# Patient Record
Sex: Male | Born: 1965 | Race: White | Hispanic: No | Marital: Married | Smoking: Never smoker
Health system: Southern US, Community
[De-identification: ages and names within clinical notes are randomized; demographics above are authoritative.]

---

## 2007-05-01 ENCOUNTER — Encounter: Admission: RE | Admit: 2007-05-01 | Discharge: 2007-05-01 | Payer: Self-pay | Admitting: Otolaryngology

## 2015-06-08 ENCOUNTER — Encounter: Payer: Self-pay | Admitting: Sports Medicine

## 2015-06-08 ENCOUNTER — Ambulatory Visit (INDEPENDENT_AMBULATORY_CARE_PROVIDER_SITE_OTHER): Payer: BLUE CROSS/BLUE SHIELD | Admitting: Sports Medicine

## 2015-06-08 VITALS — BP 140/71 | HR 86 | Ht 74.0 in | Wt 300.0 lb

## 2015-06-08 DIAGNOSIS — M7582 Other shoulder lesions, left shoulder: Secondary | ICD-10-CM | POA: Diagnosis not present

## 2015-06-08 MED ORDER — NITROGLYCERIN 0.2 MG/HR TD PT24: MEDICATED_PATCH | TRANSDERMAL | Status: AC

## 2015-06-08 NOTE — Assessment & Plan Note (Signed)
HEP - he is knowledgable and works out with weights regularly Nistructed in low weight and high rep for RCT  NTG protocol  Reck in 6 weeks

## 2015-06-08 NOTE — Progress Notes (Signed)
Patient ID: Shane Tapia, male   DOB: May 24, 1965, 50 y.o.   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 Palpation is normal with no tenderness over AC joint or bicipital groove. ROM is full in all planes.  He does show hesitation on full elevation on left Rotator cuff strength normal throughout.  Very positive signs of impingement with weakness and pain on  Hawkin's tests and empty can. Speeds and Yergason's tests normal. No labral pathology noted with negative Obrien's, negative clunk and good stability. Normal scapular function observed. No painful arc and no drop arm sign. No apprehension sign  Shoulder [place] BT normal AC joint with only mild arthritic change Subscap - tendon intact with some mild distal calcification SST - distally there is hypoechoic change and anterior edge small tear (0.5 cm) This impinges with motion Infraspinatus is normal

## 2015-06-16 ENCOUNTER — Encounter: Payer: Self-pay | Admitting: Sports Medicine

## 2015-07-21 ENCOUNTER — Ambulatory Visit (INDEPENDENT_AMBULATORY_CARE_PROVIDER_SITE_OTHER): Payer: BLUE CROSS/BLUE SHIELD | Admitting: Sports Medicine

## 2015-07-21 ENCOUNTER — Encounter: Payer: Self-pay | Admitting: Sports Medicine

## 2015-07-21 VITALS — BP 133/73 | HR 90 | Ht 74.0 in | Wt 300.0 lb

## 2015-07-21 DIAGNOSIS — M7582 Other shoulder lesions, left shoulder: Secondary | ICD-10-CM

## 2015-07-21 NOTE — Progress Notes (Signed)
Patient ID: Shane Tapia, male   DOB: Jul 16, 1965, 50 y.o.   MRN: [id number]  CC: Left shoulder pain, follow-up  Referred: courtesy of Dr Renne CriglerPharr  Athletic M with Lt shoulder pain, diagnosed with rotator cuff tendinitis at last visit on 06/08/2015 - Lifts weights - but not a specific injury - Night pain - lying or when elevated. This has significantly although not completely improved since his last visit - Aggravating factors - elevation of arm/ [facility]. He has avoided these activities since his last visit - Using nitroglycerin patches daily. He denies any side effects. - No new injuries. - Progressing with rotator cuff strengthening. He has increased his resistance up to 10 pounds. - Significant overall improvement after initial 3 weeks.  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 Palpation is normal with no tenderness over AC joint or bicipital groove. ROM is full in all planes. Minimal hesitancy with abduction Rotator cuff strength normal throughout.  Mild to moderately positive signs of impingement with weakness and pain on  Hawkin's tests and empty can. Negative Neer Speeds and Yergason's tests normal. Normal scapular function observed. No painful arc and no drop arm sign. No apprehension sign  Shoulder US, repeat from 06/08/2015 BT normal AC joint with only mild arthritic change Subscap - tendon intact with some mild distal calcification. No change on today's scan SST -the previously identified hypoechoic change and anterior edge small tear (0.5 cm) appears to have healed. Impingement with motion has significantly improved. Only mild  impingement identified. Infraspinatus is normal  Left rotator cuff pathology: This includes a persistent distal subscap calcific tendinitis and a resolving supraspinatus tear and subdeltoid bursitis.  - Clinically doing much better as well. - Continue nitroglycerin - Continue working on more reps rather than increased weight. Limit rotator cuff strengthening to 10 pounds max. - Follow-up in 6-7 weeks. Call with any questions in the interim.

## 2015-09-15 ENCOUNTER — Ambulatory Visit (INDEPENDENT_AMBULATORY_CARE_PROVIDER_SITE_OTHER): Payer: BLUE CROSS/BLUE SHIELD | Admitting: Sports Medicine

## 2015-09-15 ENCOUNTER — Encounter: Payer: Self-pay | Admitting: Sports Medicine

## 2015-09-15 VITALS — BP 130/72 | HR 88 | Ht 74.0 in | Wt 295.0 lb

## 2015-09-15 DIAGNOSIS — M7582 Other shoulder lesions, left shoulder: Secondary | ICD-10-CM

## 2015-09-15 NOTE — Progress Notes (Signed)
Patient ID: Shane Tapia, male   DOB: May 27, 1965, 50 y.o.   MRN: [id number]  CC: Left shoulder pain, follow-up  Referred: courtesy of Dr Renne CriglerPharr  Athletic M with Lt shoulder pain, diagnosed with rotator cuff tendinitis at last visit on 07/21/2015 - Lifts weights - but not a specific injury - Night pain - lying or when elevated. This has continued to improve since his last visit and he now only has tightness in the morning. It does not wake him from sleep.  - Using nitroglycerin patches daily. He denies any side effects. - No new injuries. - Progressing with rotator cuff strengthening. He has increased his resistance up to 10 pounds.he feels ready to go past 90 with abduction and forward flexion exercises. 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 Palpation is normal with no tenderness over AC joint or bicipital groove. ROM is full in all planes. No longer any hesitancy with abduction.  Rotator cuff strength normal throughout.  Negative Neer and Hawkins. Negative full can. Minimal pain with empty can. 5 out of 5 strength with both full and empty can.  Speeds and Yergason's tests normal. Normal scapular function observed. No painful arc and no drop arm sign. No apprehension sign  Shoulder US, repeat from 06/08/2015 BT normal AC joint with only mild arthritic change Subscap - tendon intact with some mild distal calcification. No change on today's scan SST - again,the previously identified hypoechoic change and anterior edge small tear (0.5 cm) appears to have healed. Infraspinatus is normal  Left rotator cuff pathology:  This includes a persistent distal subscap calcific tendinitis and resolved supraspinatus tear both the ultrasound and clinically. Overall he is very pleased with his progress over the last 3-1/2 months.  - Taper off of nitroglycerin - begin attempting overhead lifts if desired. Continue gradual return to previous lifting. Discussed vulnerability of the shoulder above 90 forward flexion and abduction. - Follow up with any questions or concerns.

## 2020-10-28 ENCOUNTER — Other Ambulatory Visit: Payer: Self-pay | Admitting: Internal Medicine

## 2020-10-28 DIAGNOSIS — E7800 Pure hypercholesterolemia, unspecified: Secondary | ICD-10-CM

## 2020-12-01 ENCOUNTER — Ambulatory Visit
Admission: RE | Admit: 2020-12-01 | Discharge: 2020-12-01 | Disposition: A | Discharge status: Home / Self Care | Payer: No Typology Code available for payment source | Source: Ambulatory Visit | Attending: Internal Medicine | Admitting: Internal Medicine

## 2020-12-01 DIAGNOSIS — E7800 Pure hypercholesterolemia, unspecified: Secondary | ICD-10-CM

## 2022-06-04 IMAGING — CT CT CARDIAC CORONARY ARTERY CALCIUM SCORE
3 series · 14 of 20 positions shown, 16 images · non-contrast
Comparison: None.

CLINICAL DATA: 54-year-old Caucasian male with history
hyperlipidemia.

EXAM:
CT CARDIAC CORONARY ARTERY CALCIUM SCORE
TECHNIQUE: Non-contrast imaging through the heart was performed using
prospective ECG gating. Image post processing was performed on an
independent workstation, allowing for quantitative analysis of the
heart and coronary arteries. Note that this exam targets the heart
and the chest was not imaged in its entirety.

[Series 2: calcium scoring 2.00 qr36 bestdiast 63% hrt calciu · axial · 0.46mm/px · z∈[+1629,+1707]mm · 4 of 65 slices shown]
[im 13/65  vessel]
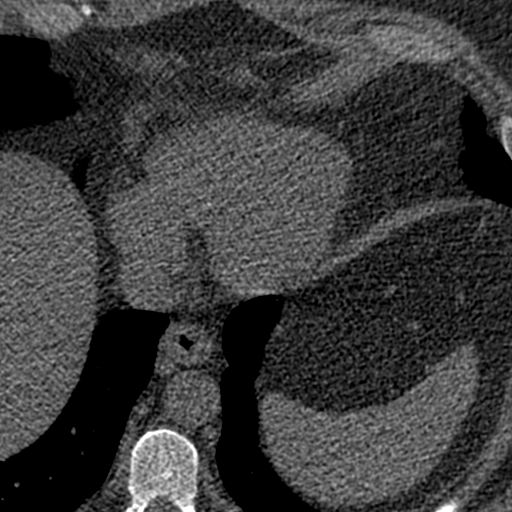
[im 26/65  vessel]
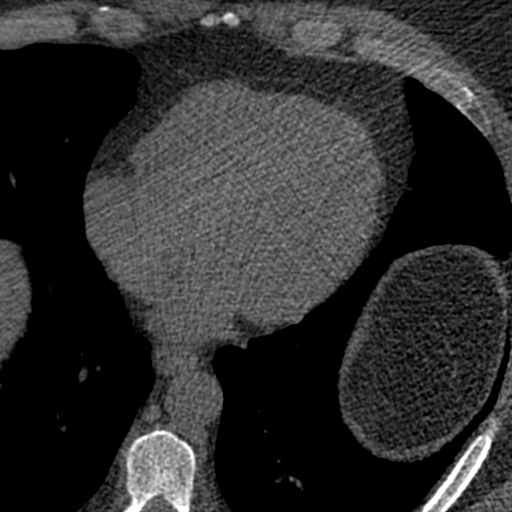
[im 39/65  vessel]
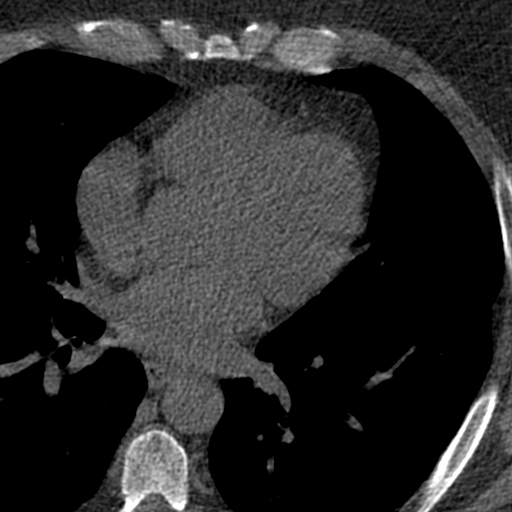
[im 52/65  vessel]
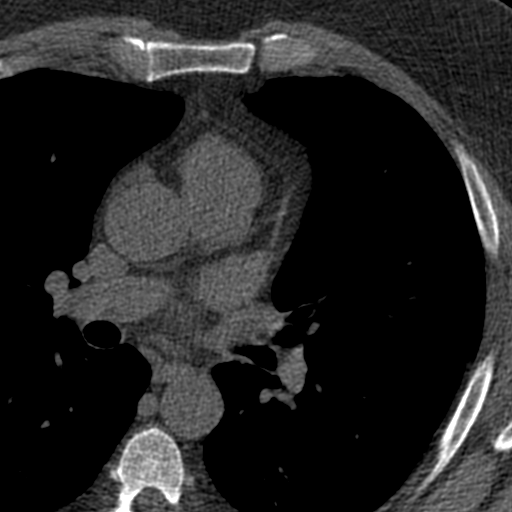

[Series 3: calcium scoring 2.00 br40 bestdiast 63% axial · axial · 0.71mm/px · z∈[+1625,+1711]mm · 5 of 65 slices shown, 7 images]
[im 11/65  vessel]
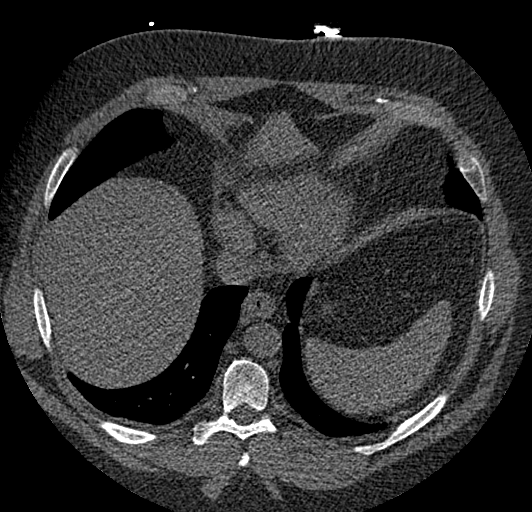
[im 11/65  lung]
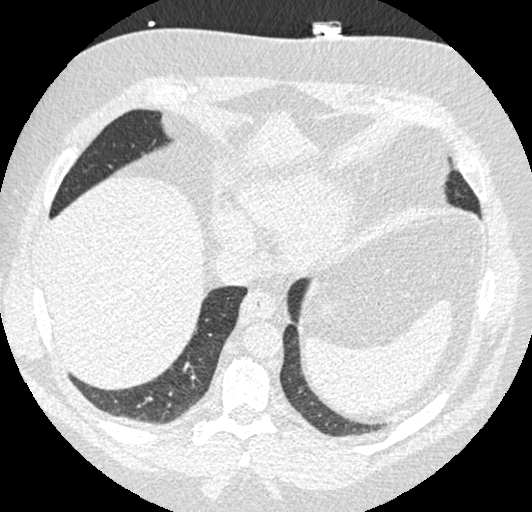
[im 22/65  vessel]
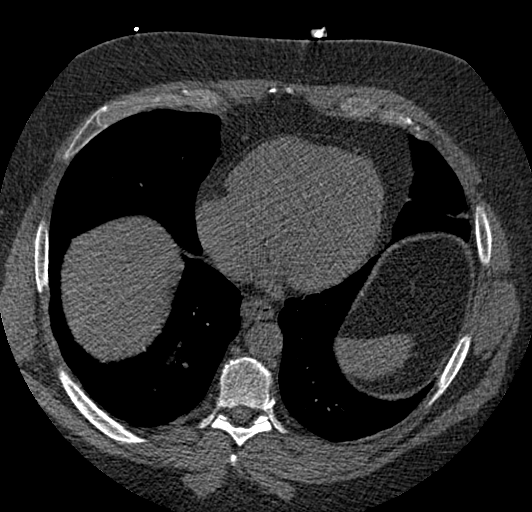
[im 33/65  vessel]
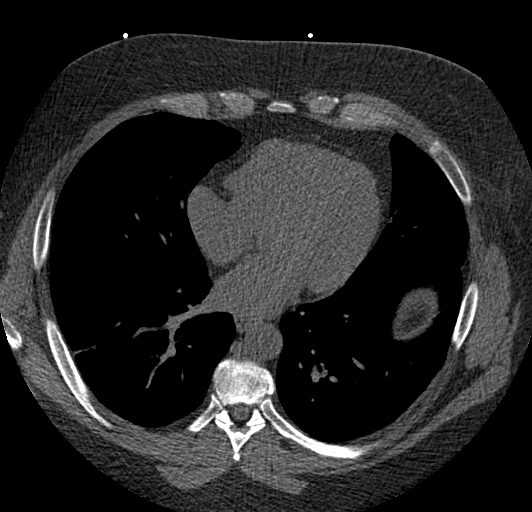
[im 43/65  vessel]
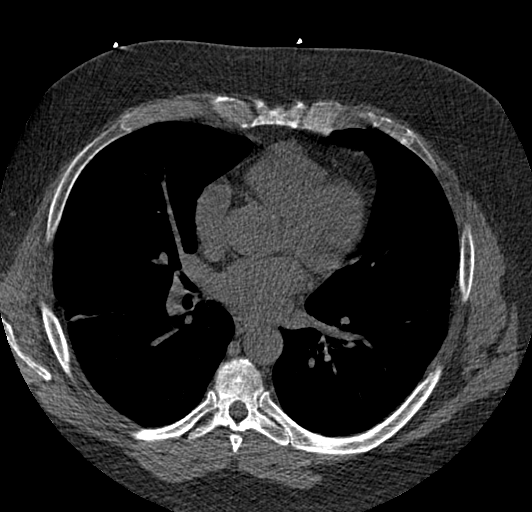
[im 54/65  vessel]
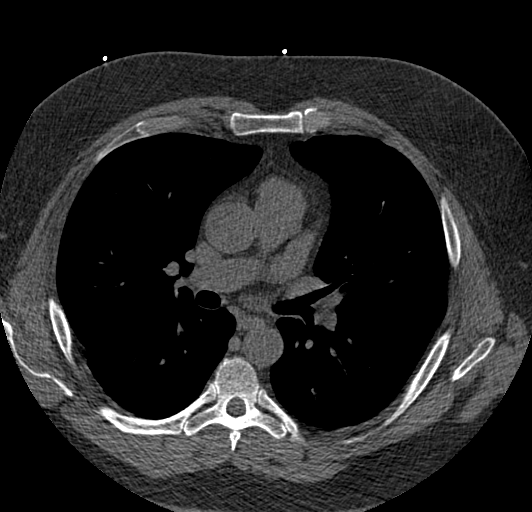
[im 54/65  lung]
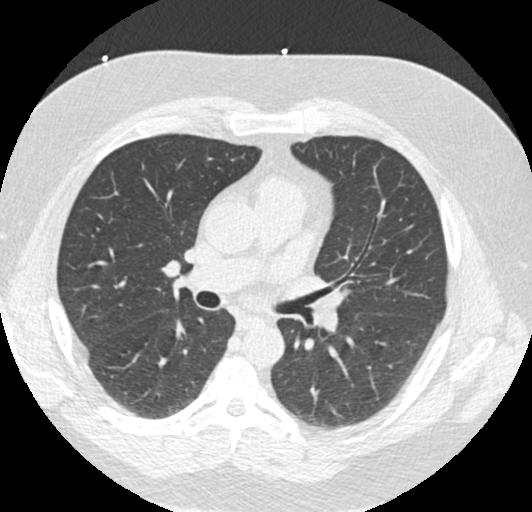

[Series 9: calcium scoring 2.00 br60 bestdiast 63% lungs · axial · 0.71mm/px · z∈[+1625,+1711]mm · 5 of 65 slices shown]
[im 11/65  vessel]
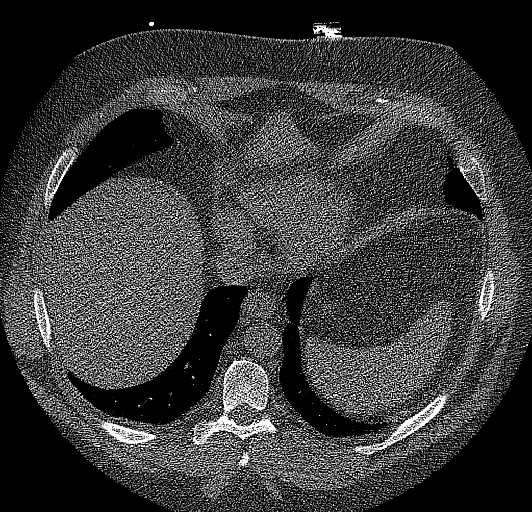
[im 22/65  vessel]
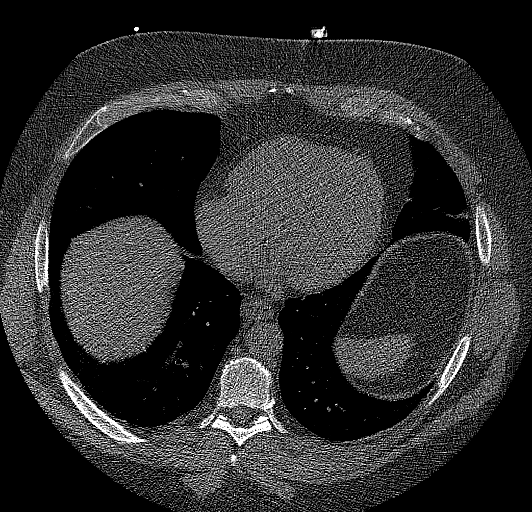
[im 33/65  vessel]
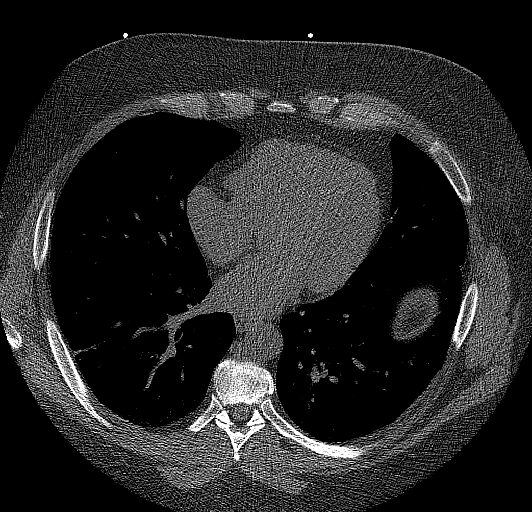
[im 43/65  vessel]
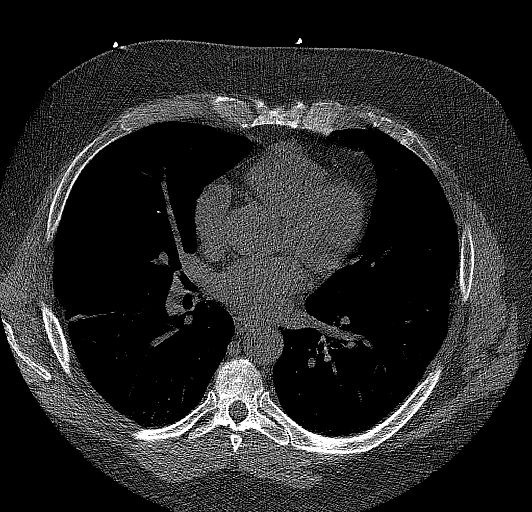
[im 54/65  vessel]
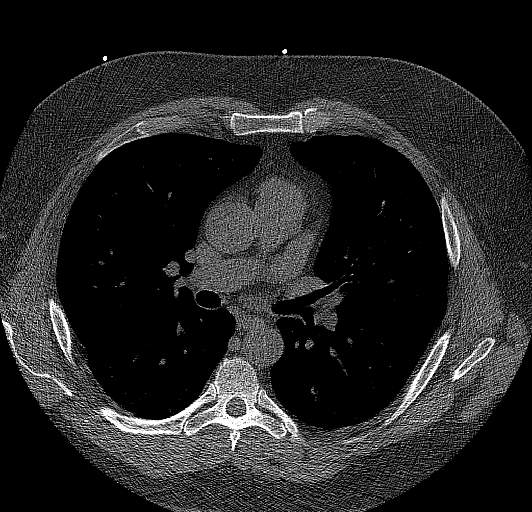

[14 of 20 positions shown; findings below may reference images not displayed]

FINDINGS: CORONARY CALCIUM SCORES:

Left Main: 0

LAD:

LCx: 0

RCA:

Total Agatston Score:

[HOSPITAL] percentile: 64

AORTA MEASUREMENTS:

Ascending Aorta: 35 mm

Descending Aorta: 27 mm

OTHER FINDINGS:

The heart size is within normal limits. No pericardial fluid is
identified. Visualized segments of the thoracic aorta and central
pulmonary arteries are normal in caliber. Visualized mediastinum and
hilar regions demonstrate no lymphadenopathy or masses. Visualized
lungs show no evidence of pulmonary edema, consolidation,
pneumothorax, nodule or pleural fluid. Visualized upper abdomen and
bony structures are unremarkable.
IMPRESSION: Coronary calcium score of 29.4 is at the 64th percentile for the
patient's age, sex and race.

## 2022-11-23 ENCOUNTER — Ambulatory Visit: Payer: BC Managed Care – PPO | Admitting: Sports Medicine

## 2022-11-23 VITALS — BP 122/88 | HR 68 | Ht 74.0 in | Wt 295.0 lb

## 2022-11-23 DIAGNOSIS — S76012A Strain of muscle, fascia and tendon of left hip, initial encounter: Secondary | ICD-10-CM

## 2022-11-23 MED ORDER — MELOXICAM 15 MG PO TABS: 15.0000 mg | ORAL_TABLET | Freq: Every day | ORAL | 0 refills | Status: AC

## 2022-11-23 NOTE — Patient Instructions (Signed)
-   Start meloxicam 15 mg daily x2 weeks.  If still having pain after 2 weeks, complete 3rd-week of meloxicam. May use remaining meloxicam as needed once daily for pain control.  Do not to use additional NSAIDs while taking meloxicam.  May use Tylenol [phone] mg 2 to 3 times a day for breakthrough pain. Hip HEP  Avoid running for 1-2 weeks gradual introduction  4 week follow up

## 2022-11-23 NOTE — Progress Notes (Signed)
    Aleen Sells D.[facility] [address] [place] [address] Phone: [phone]   Assessment and Plan:     1. Strain of flexor muscle of left hip, initial encounter -Acute, initial sports medicine visit - Most consistent with left hip flexor strain, likely occurring during running activities - No red flag symptoms, so no imaging at today's visit - Recommend no running for the next 1 to 2 weeks and then gradual reintroduction as tolerated.  Recommend starting at 50% intensity and duration, and gradually increasing to 75% and 100% as tolerated - Start meloxicam 15 mg daily x2 weeks.  If still having pain after 2 weeks, complete 3rd-week of meloxicam. May use remaining meloxicam as needed once daily for pain control.  Do not to use additional NSAIDs while taking meloxicam.  May use Tylenol [phone] mg 2 to 3 times a day for breakthrough pain. - May continue low impact cardio exercises such as swimming   Pertinent previous records reviewed include none   Follow Up: 4 weeks for reevaluation.  If no improvement or worsening of symptoms, would obtain x-ray and could consider ultrasound versus physical therapy   Subjective:   I, Shane Tapia, am serving as a Neurosurgeon for Doctor Richardean Sale  Chief Complaint: left hip pain   HPI:   11/23/22 [name] complaining of left hip pain. Patient states that he was jogging and as he was jogging the hip got tight and painful. This happened 2 weeks ago. Decreased ROM. Sitting and standing is fine. The transition from sitting to walking is painful . Antalgic gait. Tylenol and ibu for the pain and that helped some. No numbness tingling. Pain does radiate down the leg some, he does endorse some left leg calf cramping as well.    Relevant Historical Information: None pertinent  Additional pertinent review of systems negative.   Current Outpatient Medications:    meloxicam (MOBIC) 15 MG  tablet, Take 1 tablet (15 mg total) by mouth daily., Disp: 30 tablet, Rfl: 0   nitroGLYCERIN (NITRODUR - DOSED IN MG/24 HR) 0.2 mg/hr patch, Place 1/4 patch to affected area daily, Disp: 30 patch, Rfl: 1   Triamcinolone Acetonide (NASACORT ALLERGY 24HR NA), Place into the nose., Disp: , Rfl:    Objective:     Vitals:   11/23/22 0920  BP: 122/88  Pulse: 68  SpO2: 97%  Weight: 295 lb (133.8 kg)  Height: 6\' 2"  (1.88 m)      Body mass index is 37.88 kg/m.    Physical Exam:    General: awake, alert, and oriented no acute distress, nontoxic Skin: no suspicious lesions or rashes Neuro:sensation intact distally with no deficits, normal muscle tone, no atrophy, strength 5/5 in all tested lower ext groups Psych: normal mood and affect, speech clear   Left hip: No deformity, swelling or wasting ROM Flexion 90, ext 30, IR 45, ER 45 TTP hip flexors NTTP over the  , greater trochanter, gluteal musculature, si joint, lumbar spine Negative log roll with FROM Negative FABER Negative FADIR Negative Piriformis test Positive left trendelenberg Gait normal  Pain with restricted hip flexion over anterior hip.  No pain with resisted hip extension, internal rotation, external rotation Thomas test with 1 fingerbreadth on left and 3 fingerbreadths on right  Electronically signed by:  Aleen Sells [facility] 11:34 AM 11/23/22

## 2022-12-20 ENCOUNTER — Other Ambulatory Visit: Payer: Self-pay | Admitting: Sports Medicine

## 2022-12-20 NOTE — Progress Notes (Unsigned)
[facility] [address] [place] [address] Phone: [phone]   Assessment and Plan:    1. Strain of flexor muscle of left hip, subsequent encounter -Subacute, improving, subsequent visit - Overall improvement in left hip pain, however patient is still having discomfort and tightness with certain activities such as walking and running and deep hip flexion.  Patient may be experiencing flare of mild arthritis that has improved but not fully resolved - X-ray obtained in clinic.  My interpretation: No acute fracture or dislocation.  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  Referral sent  Pertinent previous records reviewed include none   Follow Up: 4 weeks for reevaluation.  Could consider intra-articular CSI if no improvement or worsening symptoms   Subjective:   I, Shane Tapia, am serving as a Neurosurgeon for Shane Tapia   Chief Complaint: left hip pain    HPI:    11/23/22 [name] complaining of left hip pain. Patient states that he was jogging and as he was jogging the hip got tight and painful. This happened 2 weeks ago. Decreased ROM. Sitting and standing is fine. The transition from sitting to walking is painful . Antalgic gait. Tylenol and ibu for the pain and that helped some. No numbness tingling. Pain does radiate down the leg some, he does endorse some left leg calf cramping as well.    12/21/2022 Patient states that he is still sore and tight has had some improvement . Doesn't feel comfortable doing anything on it     Relevant Historical Information: None pertinent  Additional pertinent review of systems negative.   Current Outpatient Medications:    meloxicam (MOBIC) 15 MG tablet, Take 1 tablet (15 mg total) by mouth daily.,  Disp: 30 tablet, Rfl: 0   methylPREDNISolone (MEDROL DOSEPAK) 4 MG TBPK tablet, Take 6 tablets on day 1.  Take 5 tablets on day 2.  Take 4 tablets on day 3.  Take 3 tablets on day 4.  Take 2 tablets on day 5.  Take 1 tablet on day 6., Disp: 21 tablet, Rfl: 0   nitroGLYCERIN (NITRODUR - DOSED IN MG/24 HR) 0.2 mg/hr patch, Place 1/4 patch to affected area daily, Disp: 30 patch, Rfl: 1   Triamcinolone Acetonide (NASACORT ALLERGY 24HR NA), Place into the nose., Disp: , Rfl:    Objective:     Vitals:   12/21/22 0759  BP: 130/80  Pulse: 74  SpO2: 98%  Weight: 280 lb (127 kg)  Height: 6\' 2"  (1.88 m)      Body mass index is 35.95 kg/m.    Physical Exam:    General: awake, alert, and oriented no acute distress, nontoxic Skin: no suspicious lesions or rashes Neuro:sensation intact distally with no deficits, normal muscle tone, no atrophy, strength 5/5 in all tested lower ext groups Psych: normal mood and affect, speech clear   Left hip: No deformity, swelling or wasting ROM Flexion 90, ext 30, IR 45, ER 45 TTP hip flexors NTTP over the  , greater trochanter, gluteal musculature, si joint, lumbar spine Negative log roll with FROM Negative FABER Negative FADIR Negative Piriformis test Positive left trendelenberg Gait normal  Pain with hip flexion over anterior hip.  No pain with resisted hip extension, internal rotation, external rotation Thomas test with 0  fingerbreadth on left and 0 fingerbreadths on right    Electronically signed by:  [facility] Tapia [facility] 8:41 AM 12/21/22

## 2022-12-21 ENCOUNTER — Ambulatory Visit (INDEPENDENT_AMBULATORY_CARE_PROVIDER_SITE_OTHER): Payer: BC Managed Care – PPO

## 2022-12-21 ENCOUNTER — Ambulatory Visit (INDEPENDENT_AMBULATORY_CARE_PROVIDER_SITE_OTHER): Payer: BC Managed Care – PPO | Admitting: Sports Medicine

## 2022-12-21 VITALS — BP 130/80 | HR 74 | Ht 74.0 in | Wt 280.0 lb

## 2022-12-21 DIAGNOSIS — S76012D Strain of muscle, fascia and tendon of left hip, subsequent encounter: Secondary | ICD-10-CM | POA: Diagnosis not present

## 2022-12-21 MED ORDER — METHYLPREDNISOLONE 4 MG PO TBPK: ORAL_TABLET | ORAL | 0 refills | Status: AC

## 2022-12-21 NOTE — Patient Instructions (Signed)
Pt referral  Prednisone dos pak  4 week follow up

## 2023-01-17 NOTE — Progress Notes (Unsigned)
    Aleen [facility]ells D.[facility] [address] [place] [address] Phone: [phone]   Assessment and Plan:     There are no diagnoses linked to this encounter.  ***   Pertinent previous records reviewed include ***   Follow Up: ***     Subjective:   I, Carollyn Etcheverry, am serving as a Neurosurgeon for Doctor Richardean Sale   Chief Complaint: left hip pain    HPI:    11/23/22 [name] complaining of left hip pain. Patient states that he was jogging and as he was jogging the hip got tight and painful. This happened 2 weeks ago. Decreased ROM. Sitting and standing is fine. The transition from sitting to walking is painful . Antalgic gait. Tylenol and ibu for the pain and that helped some. No numbness tingling. Pain does radiate down the leg some, he does endorse some left leg calf cramping as well.    12/21/2022 Patient states that he is still sore and tight has had some improvement . Doesn't feel comfortable doing anything on it   01/18/2023 Patient states    Relevant Historical Information: None pertinent  Additional pertinent review of systems negative.   Current Outpatient Medications:    meloxicam (MOBIC) 15 MG tablet, Take 1 tablet (15 mg total) by mouth daily., Disp: 30 tablet, Rfl: 0   methylPREDNISolone (MEDROL DOSEPAK) 4 MG TBPK tablet, Take 6 tablets on day 1.  Take 5 tablets on day 2.  Take 4 tablets on day 3.  Take 3 tablets on day 4.  Take 2 tablets on day 5.  Take 1 tablet on day 6., Disp: 21 tablet, Rfl: 0   nitroGLYCERIN (NITRODUR - DOSED IN MG/24 HR) 0.2 mg/hr patch, Place 1/4 patch to affected area daily, Disp: 30 patch, Rfl: 1   Triamcinolone Acetonide (NASACORT ALLERGY 24HR NA), Place into the nose., Disp: , Rfl:    Objective:     There were no vitals filed for this visit.    There is no height or weight on file to calculate BMI.    Physical Exam:    ***   Electronically signed by:  Aleen Sells [facility] 3:13 PM 01/17/23

## 2023-01-18 ENCOUNTER — Ambulatory Visit: Payer: BC Managed Care – PPO | Admitting: Sports Medicine

## 2023-01-18 VITALS — HR 82 | Ht 74.0 in | Wt 287.0 lb

## 2023-01-18 DIAGNOSIS — S76012D Strain of muscle, fascia and tendon of left hip, subsequent encounter: Secondary | ICD-10-CM

## 2023-02-05 ENCOUNTER — Other Ambulatory Visit: Payer: Self-pay | Admitting: Sports Medicine

## 2023-02-05 DIAGNOSIS — S76012D Strain of muscle, fascia and tendon of left hip, subsequent encounter: Secondary | ICD-10-CM

## 2023-02-05 DIAGNOSIS — S76012A Strain of muscle, fascia and tendon of left hip, initial encounter: Secondary | ICD-10-CM

## 2023-02-05 NOTE — Progress Notes (Unsigned)
PT referral.
